# Patient Record
Sex: Female | Born: 2000 | Race: Black or African American | Hispanic: No | Marital: Single | Smoking: Current some day smoker
Health system: Southern US, Community
[De-identification: ages and names within clinical notes are randomized; demographics above are authoritative.]

## PROBLEM LIST (undated history)

## (undated) DIAGNOSIS — N83209 Unspecified ovarian cyst, unspecified side: Secondary | ICD-10-CM

## (undated) DIAGNOSIS — J45909 Unspecified asthma, uncomplicated: Secondary | ICD-10-CM

## (undated) DIAGNOSIS — E282 Polycystic ovarian syndrome: Secondary | ICD-10-CM

## (undated) DIAGNOSIS — B009 Herpesviral infection, unspecified: Secondary | ICD-10-CM

## (undated) DIAGNOSIS — E669 Obesity, unspecified: Secondary | ICD-10-CM

## (undated) HISTORY — PX: LAPAROSCOPIC GASTRIC SLEEVE RESECTION: SHX5895

---

## 2020-10-22 ENCOUNTER — Emergency Department (HOSPITAL_BASED_OUTPATIENT_CLINIC_OR_DEPARTMENT_OTHER)
Admission: EM | Admit: 2020-10-22 | Discharge: 2020-10-22 | Disposition: A | Discharge status: Home / Self Care | Payer: Medicaid Other | Attending: Emergency Medicine | Admitting: Emergency Medicine

## 2020-10-22 ENCOUNTER — Other Ambulatory Visit: Payer: Self-pay

## 2020-10-22 ENCOUNTER — Encounter (HOSPITAL_BASED_OUTPATIENT_CLINIC_OR_DEPARTMENT_OTHER): Payer: Self-pay

## 2020-10-22 ENCOUNTER — Emergency Department (HOSPITAL_BASED_OUTPATIENT_CLINIC_OR_DEPARTMENT_OTHER): Payer: Medicaid Other

## 2020-10-22 DIAGNOSIS — R0789 Other chest pain: Secondary | ICD-10-CM | POA: Diagnosis present

## 2020-10-22 DIAGNOSIS — J45909 Unspecified asthma, uncomplicated: Secondary | ICD-10-CM | POA: Diagnosis not present

## 2020-10-22 DIAGNOSIS — B349 Viral infection, unspecified: Secondary | ICD-10-CM | POA: Insufficient documentation

## 2020-10-22 DIAGNOSIS — R079 Chest pain, unspecified: Secondary | ICD-10-CM

## 2020-10-22 DIAGNOSIS — Z20822 Contact with and (suspected) exposure to covid-19: Secondary | ICD-10-CM | POA: Diagnosis not present

## 2020-10-22 HISTORY — DX: Polycystic ovarian syndrome: E28.2

## 2020-10-22 HISTORY — DX: Unspecified asthma, uncomplicated: J45.909

## 2020-10-22 LAB — RESPIRATORY PANEL BY RT PCR (FLU A&B, COVID)
Influenza A by PCR: NEGATIVE
Influenza B by PCR: NEGATIVE
SARS Coronavirus 2 by RT PCR: NEGATIVE

## 2020-10-22 LAB — BASIC METABOLIC PANEL
Anion gap: 5 (ref 5–15)
BUN: 12 mg/dL (ref 6–20)
CO2: 28 mmol/L (ref 22–32)
Calcium: 8.8 mg/dL — ABNORMAL LOW (ref 8.9–10.3)
Chloride: 103 mmol/L (ref 98–111)
Creatinine, Ser: 0.58 mg/dL (ref 0.44–1.00)
GFR, Estimated: >60 mL/min (ref >60)
Glucose, Bld: 96 mg/dL (ref 70–99)
Potassium: 4 mmol/L (ref 3.5–5.1)
Sodium: 136 mmol/L (ref 135–145)

## 2020-10-22 LAB — URINALYSIS, ROUTINE W REFLEX MICROSCOPIC
Bilirubin Urine: NEGATIVE
Glucose, UA: NEGATIVE mg/dL
Hgb urine dipstick: NEGATIVE
Ketones, ur: NEGATIVE mg/dL
Leukocytes,Ua: NEGATIVE
Nitrite: NEGATIVE
Protein, ur: NEGATIVE mg/dL
Specific Gravity, Urine: 1.02 (ref 1.005–1.030)
pH: 7 (ref 5.0–8.0)

## 2020-10-22 LAB — CBC
HCT: 41.9 % (ref 36.0–46.0)
Hemoglobin: 13 g/dL (ref 12.0–15.0)
MCH: 25.6 pg — ABNORMAL LOW (ref 26.0–34.0)
MCHC: 31 g/dL (ref 30.0–36.0)
MCV: 82.5 fL (ref 80.0–100.0)
Platelets: 308 10*3/uL (ref 150–400)
RBC: 5.08 MIL/uL (ref 3.87–5.11)
RDW: 14.6 % (ref 11.5–15.5)
WBC: 10.4 10*3/uL (ref 4.0–10.5)
nRBC: 0 % (ref 0.0–0.2)

## 2020-10-22 LAB — PREGNANCY, URINE: Preg Test, Ur: NEGATIVE

## 2020-10-22 LAB — D-DIMER, QUANTITATIVE: D-Dimer, Quant: 0.4 ug/mL-FEU (ref 0.00–0.50)

## 2020-10-22 LAB — GROUP A STREP BY PCR: Group A Strep by PCR: NOT DETECTED

## 2020-10-22 LAB — TROPONIN I (HIGH SENSITIVITY): Troponin I (High Sensitivity): <2 ng/L (ref <18)

## 2020-10-22 MED ORDER — SODIUM CHLORIDE 0.9 % IV BOLUS
1000.0000 mL | Freq: Once | INTRAVENOUS | Status: AC
  Administered 2020-10-22: 1000 mL via INTRAVENOUS

## 2020-10-22 MED ORDER — IBUPROFEN 400 MG PO TABS
600.0000 mg | ORAL_TABLET | Freq: Once | ORAL | Status: AC
  Administered 2020-10-22: 600 mg via ORAL
  Filled 2020-10-22: qty 1

## 2020-10-22 MED ORDER — ACETAMINOPHEN 500 MG PO TABS
1000.0000 mg | ORAL_TABLET | Freq: Once | ORAL | Status: AC
  Administered 2020-10-22: 1000 mg via ORAL
  Filled 2020-10-22: qty 2

## 2020-10-22 NOTE — Discharge Instructions (Signed)
Follow-up with your primary doctor regarding your symptoms from today.  Take Tylenol Motrin as needed for pain control.  If you develop worsening difficulty in breathing, chest pain or other new concerning symptom, return to ER for reassessment.

## 2020-10-22 NOTE — ED Provider Notes (Signed)
[facility] EMERGENCY DEPARTMENT Provider Note   CSN: [id number] Arrival date & time: 10/22/20  1301     History Chief Complaint  Patient presents with  . Cough  . Shortness of Breath    Sara Hurley is a 19 y.o. female.  Presents to ER with myriad complaints.  She states over the last 5 or 6 days she has noticed cough, congestion, headache.  Over the last couple days has also noted some chest discomfort as well as some shortness of breath.  Chest pain worse with coughing, occurs at rest, not associated with exertion.  Dull, achy.  Mild to moderate.  No current chest pain.  Sore throat, no difficulty with swallowing, no voice change.  No loss of taste.  No leg pain, uses IUD for contraception.  Morbidly obese, history of asthma, PCOS, s/p gastric sleeve  HPI     Past Medical History:  Diagnosis Date  . Asthma   . PCOS (polycystic ovarian syndrome)     There are no problems to display for this patient.   Past Surgical History:  Procedure Laterality Date  . LAPAROSCOPIC GASTRIC SLEEVE RESECTION       OB History   No obstetric history on file.     No family history on file.  Social History   Tobacco Use  . Smoking status: Never Smoker  . Smokeless tobacco: Never Used  Vaping Use  . Vaping Use: Never used  Substance Use Topics  . Alcohol use: Never  . Drug use: Never    Home Medications Prior to Admission medications   Medication Sig Start Date End Date Taking? Authorizing Provider  EPINEPHrine 0.3 mg/0.3 mL IJ SOAJ injection Inject 0.3 mg into the skin once as needed for anaphylaxis. 09/10/20  Yes [provider]  levonorgestrel (KYLEENA) 19.5 MG IUD 1 each by Intrauterine route once.   Yes [provider]  Multiple Vitamin (MULTIVITAMIN WITH MINERALS) TABS tablet Take 1 tablet by mouth daily.   Yes [provider]  predniSONE (DELTASONE) 20 MG tablet Take 10-60 mg by mouth See admin instructions. Take 29m daily for  3 days, then 448mdaily for 3 days, then 2019maily for 3 days and 24m36mily for 3 days. 10/03/20  Yes [provider]    Allergies    Patient has no known allergies.  Review of Systems   Review of Systems  Constitutional: Positive for chills and fatigue. Negative for fever.  HENT: Negative for ear pain and sore throat.   Eyes: Negative for pain and visual disturbance.  Respiratory: Positive for cough and shortness of breath.   Cardiovascular: Positive for chest pain. Negative for palpitations.  Gastrointestinal: Negative for abdominal pain and vomiting.  Genitourinary: Negative for dysuria and hematuria.  Musculoskeletal: Positive for arthralgias and myalgias. Negative for back pain.  Skin: Negative for color change and rash.  Neurological: Positive for headaches. Negative for seizures and syncope.  All other systems reviewed and are negative.   Physical Exam Updated Vital Signs BP 99/64   Pulse 96   Temp 99.1 F (37.3 C) (Oral)   Resp 16   Ht 5' 7"  (1.702 m)   Wt (!) 193.2 kg   SpO2 100%   BMI 66.72 kg/m   Physical Exam Vitals and nursing note reviewed.  Constitutional:      General: She is not in acute distress.    Appearance: She is well-developed.  HENT:     Head: Normocephalic and atraumatic.  Eyes:  Conjunctiva/sclera: Conjunctivae normal.  Cardiovascular:     Rate and Rhythm: Normal rate and regular rhythm.     Heart sounds: No murmur heard.   Pulmonary:     Effort: Pulmonary effort is normal. No respiratory distress.     Breath sounds: Normal breath sounds.  Abdominal:     Palpations: Abdomen is soft.     Tenderness: There is no abdominal tenderness.  Musculoskeletal:     Cervical back: Neck supple.     Right lower leg: No edema.     Left lower leg: No edema.  Skin:    General: Skin is warm and dry.  Neurological:     General: No focal deficit present.     Mental Status: She is alert.  Psychiatric:        Mood and Affect: Mood  normal.        Behavior: Behavior normal.      ED Results / Procedures / Treatments   Labs (all labs ordered are listed, but only abnormal results are displayed) Labs Reviewed  URINALYSIS, ROUTINE W REFLEX MICROSCOPIC - Abnormal; Notable for the following components:      Result Value   APPearance HAZY (*)    All other components within normal limits  CBC - Abnormal; Notable for the following components:   MCH 25.6 (*)    All other components within normal limits  BASIC METABOLIC PANEL - Abnormal; Notable for the following components:   Calcium 8.8 (*)    All other components within normal limits  RESPIRATORY PANEL BY RT PCR (FLU A&B, COVID)  GROUP A STREP BY PCR  PREGNANCY, URINE  D-DIMER, QUANTITATIVE (NOT AT [facility])  TROPONIN I (HIGH SENSITIVITY)    EKG EKG Interpretation  Date/Time:  Monday October 22 2020 14:11:35 EDT Ventricular Rate:  85 PR Interval:    QRS Duration: 111 QT Interval:  337 QTC Calculation: 401 R Axis:   7 Text Interpretation: Sinus rhythm Confirmed by Madalyn Rob [phone]) on 10/22/2020 2:31:56 PM   Radiology DG Chest Portable 1 View  Result Date: 10/22/2020 CLINICAL DATA:  Chest pain, cough, fever, evaluate for pneumonia. EXAM: PORTABLE CHEST 1 VIEW COMPARISON:  Prior chest radiographs 01/27/2019. FINDINGS: Heart size within normal limits. There is no appreciable airspace consolidation. No evidence of pleural effusion or pneumothorax. No acute bony abnormality identified. IMPRESSION: No evidence of active cardiopulmonary disease. Electronically Signed   By: Kellie Simmering DO   On: 10/22/2020 14:21    Procedures Procedures (including critical care time)  Medications Ordered in ED Medications  acetaminophen (TYLENOL) tablet 1,000 mg (1,000 mg Oral Given 10/22/20 1412)  ibuprofen (ADVIL) tablet 600 mg (600 mg Oral Given 10/22/20 1412)  sodium chloride 0.9 % bolus 1,000 mL (1,000 mLs Intravenous New Bag/Given 10/22/20 1450)    ED Course  I  have reviewed the triage vital signs and the nursing notes.  Pertinent labs & imaging results that were available during my care of the patient were reviewed by me and considered in my medical decision making (see chart for details).  Clinical Course as of Oct 23 1523  Mon Oct 22, 2020  1514 Signed out to Karle Starch   [RD]    Clinical Course User Index [RD] Lucrezia Starch, MD   MDM Rules/Calculators/A&P                         19 year old lady presents to ER with cough, congestion, chest pain, shortness of breath, sore  throat.  On exam patient noted to be well-appearing in no distress.  Lungs were clear, abdomen soft.  Her initial vital signs were noted for borderline soft blood pressure and elevated heart rate.  Repeat vital signs without intervention were markedly improved.  Covid was negative.  CXR negative.  EKG without acute ischemic changes.  Due to initial vital sign abnormality, reported symptoms, will check dimer, troponin to rule out PE, ACS although I have a relatively low suspicion for these complaints.  If negative, suspect most likely viral process.  Signed out to [facility] pending labs.   Final Clinical Impression(s) / ED Diagnoses Final diagnoses:  Acute viral syndrome  Chest pain, unspecified type    Rx / DC Orders ED Discharge Orders    None       Lucrezia Starch, MD 10/22/20 1524

## 2020-10-22 NOTE — ED Triage Notes (Signed)
C/o flu like sx x 1 week-NAD-steady gait 

## 2021-08-12 IMAGING — DX DG CHEST 1V PORT
1 series · 1 of 1 positions shown · non-contrast
Comparison: Prior chest radiographs 01/27/2019.

CLINICAL DATA: Chest pain, cough, fever, evaluate for pneumonia.

EXAM:
PORTABLE CHEST 1 VIEW

[chest ap]
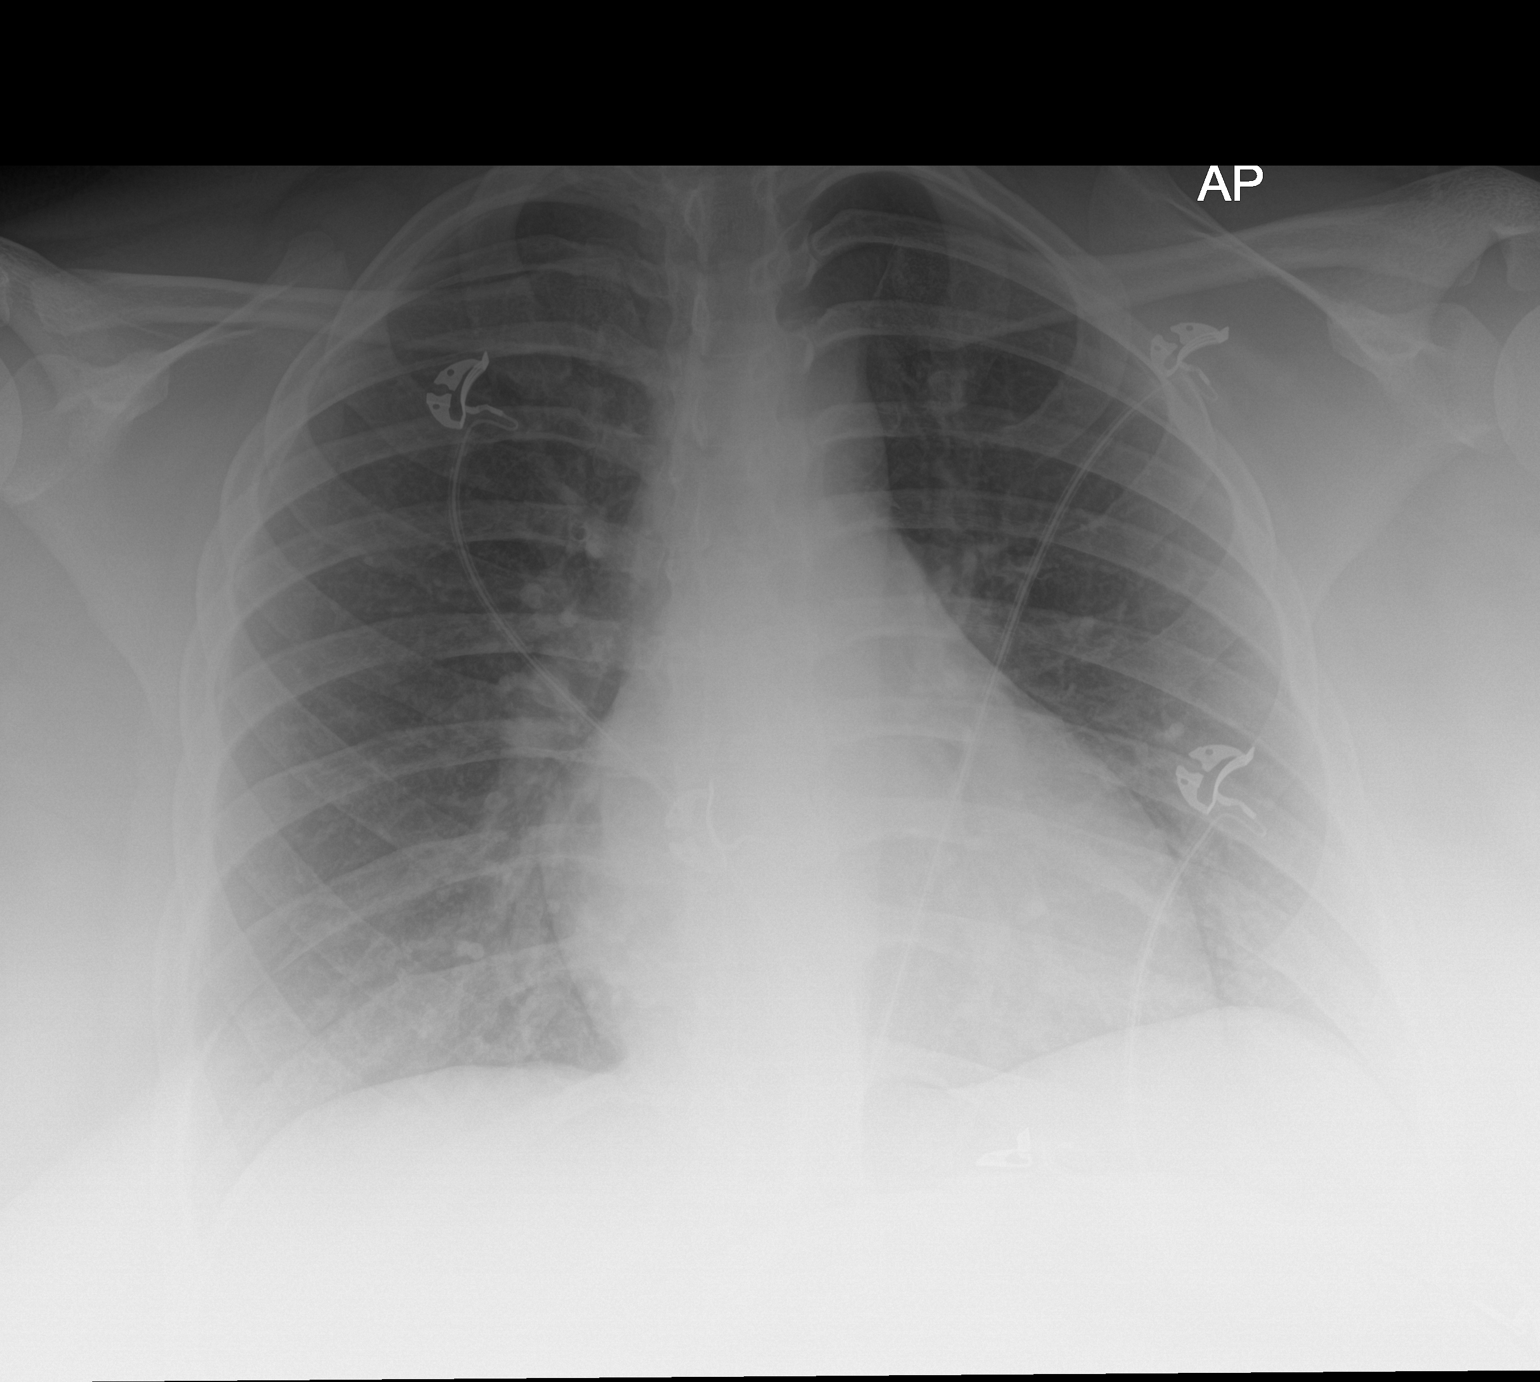

[1 of 1 positions shown; findings below may reference images not displayed]

FINDINGS: Heart size within normal limits.

There is no appreciable airspace consolidation.

No evidence of pleural effusion or pneumothorax.

No acute bony abnormality identified.
IMPRESSION: No evidence of active cardiopulmonary disease.

## 2021-12-16 ENCOUNTER — Other Ambulatory Visit: Payer: Self-pay

## 2021-12-16 ENCOUNTER — Emergency Department (HOSPITAL_BASED_OUTPATIENT_CLINIC_OR_DEPARTMENT_OTHER)
Admission: EM | Admit: 2021-12-16 | Discharge: 2021-12-16 | Disposition: A | Discharge status: Home / Self Care | Payer: Medicaid Other | Attending: Emergency Medicine | Admitting: Emergency Medicine

## 2021-12-16 ENCOUNTER — Encounter (HOSPITAL_BASED_OUTPATIENT_CLINIC_OR_DEPARTMENT_OTHER): Payer: Self-pay

## 2021-12-16 DIAGNOSIS — Z20822 Contact with and (suspected) exposure to covid-19: Secondary | ICD-10-CM | POA: Diagnosis not present

## 2021-12-16 DIAGNOSIS — R110 Nausea: Secondary | ICD-10-CM | POA: Diagnosis not present

## 2021-12-16 DIAGNOSIS — J45909 Unspecified asthma, uncomplicated: Secondary | ICD-10-CM | POA: Diagnosis not present

## 2021-12-16 DIAGNOSIS — J069 Acute upper respiratory infection, unspecified: Secondary | ICD-10-CM | POA: Diagnosis not present

## 2021-12-16 DIAGNOSIS — R059 Cough, unspecified: Secondary | ICD-10-CM | POA: Diagnosis present

## 2021-12-16 DIAGNOSIS — R062 Wheezing: Secondary | ICD-10-CM

## 2021-12-16 DIAGNOSIS — J3489 Other specified disorders of nose and nasal sinuses: Secondary | ICD-10-CM | POA: Diagnosis not present

## 2021-12-16 LAB — RESP PANEL BY RT-PCR (FLU A&B, COVID) ARPGX2
Influenza A by PCR: NEGATIVE
Influenza B by PCR: NEGATIVE
SARS Coronavirus 2 by RT PCR: NEGATIVE

## 2021-12-16 MED ORDER — PREDNISONE 10 MG PO TABS: 40.0000 mg | ORAL_TABLET | Freq: Every day | ORAL | 0 refills | Status: AC

## 2021-12-16 MED ORDER — IPRATROPIUM-ALBUTEROL 0.5-2.5 (3) MG/3ML IN SOLN
3.0000 mL | Freq: Once | RESPIRATORY_TRACT | Status: AC
  Administered 2021-12-16: 16:00:00 3 mL via RESPIRATORY_TRACT
  Filled 2021-12-16: qty 3

## 2021-12-16 MED ORDER — ALBUTEROL SULFATE HFA 108 (90 BASE) MCG/ACT IN AERS
1.0000 | INHALATION_SPRAY | Freq: Four times a day (QID) | RESPIRATORY_TRACT | 0 refills | Status: AC | PRN
Start: 2021-12-16 — End: ?

## 2021-12-16 MED ORDER — DEXAMETHASONE SODIUM PHOSPHATE 10 MG/ML IJ SOLN
8.0000 mg | Freq: Once | INTRAMUSCULAR | Status: AC
  Administered 2021-12-16: 8 mg via INTRAMUSCULAR
  Filled 2021-12-16: qty 1

## 2021-12-16 NOTE — ED Triage Notes (Signed)
Pt c/o flu like sx x 4 days-RT in for assessment prior to triage-pt NAD-steady gait

## 2021-12-16 NOTE — Discharge Instructions (Signed)
I am prescribing you a strong steroid medication called prednisone.  Please only take this as prescribed.  Please take it early in the morning, as this medication can be stimulating and make it difficult to sleep at night.  I have also prescribed you a spare albuterol inhaler.  Please use this as prescribed for wheezing, cough, and shortness of breath.  If you develop any new or worsening symptoms please come back to the emergency department.

## 2021-12-16 NOTE — ED Provider Notes (Signed)
[facility] EMERGENCY DEPARTMENT Provider Note   CSN: [id number] Arrival date & time: 12/16/21  1402     History Chief Complaint  Patient presents with   Cough    Sara Hurley is a 20 y.o. female.  HPI Patient is a 20 year old female with a history of asthma who presents to the emergency department due to cough, rhinorrhea, sore throat, body aches for the past 4 days.  Patient states she woke up this morning with chest pain and shortness of breath as well.  Chest pain occurs with coughing.  Patient notes a history of asthma.  She states that she used her albuterol inhaler at home without significant relief and came to the emergency department.  Reports associated nausea without vomiting or diarrhea.    Past Medical History:  Diagnosis Date   Asthma    PCOS (polycystic ovarian syndrome)     There are no problems to display for this patient.   Past Surgical History:  Procedure Laterality Date   LAPAROSCOPIC GASTRIC SLEEVE RESECTION       OB History   No obstetric history on file.     History reviewed. No pertinent family history.  Social History   Tobacco Use   Smoking status: Never   Smokeless tobacco: Never  Vaping Use   Vaping Use: Former  Substance Use Topics   Alcohol use: Never   Drug use: Never    Home Medications Prior to Admission medications   Medication Sig Start Date End Date Taking? Authorizing Provider  albuterol (VENTOLIN HFA) 108 (90 Base) MCG/ACT inhaler Inhale 1-2 puffs into the lungs every 6 (six) hours as needed for wheezing or shortness of breath. 12/16/21  Yes Placido Sou, PA-C  predniSONE (DELTASONE) 10 MG tablet Take 4 tablets (40 mg total) by mouth daily with breakfast for 4 days. 12/16/21 12/20/21 Yes Placido Sou, PA-C  EPINEPHrine 0.3 mg/0.3 mL IJ SOAJ injection Inject 0.3 mg into the skin once as needed for anaphylaxis. 09/10/20   [provider]  levonorgestrel (KYLEENA) 19.5 MG IUD 1 each by  Intrauterine route once.    [provider]  Multiple Vitamin (MULTIVITAMIN WITH MINERALS) TABS tablet Take 1 tablet by mouth daily.    [provider]    Allergies    Patient has no known allergies.  Review of Systems   Review of Systems  Constitutional:  Positive for chills, fatigue and fever.  HENT:  Positive for congestion, rhinorrhea and sore throat.   Respiratory:  Positive for cough and shortness of breath.   Cardiovascular:  Positive for chest pain.  Gastrointestinal:  Positive for nausea. Negative for diarrhea and vomiting.   Physical Exam Updated Vital Signs BP 127/68 (BP Location: Right Arm)    Pulse 96    Temp 98 F (36.7 C) (Oral)    Resp 20    Ht 5' 7.5" (1.715 m)    LMP 11/25/2021    SpO2 99%    BMI 65.74 kg/m   Physical Exam Vitals and nursing note reviewed.  Constitutional:      General: She is not in acute distress.    Appearance: Normal appearance. She is not ill-appearing, toxic-appearing or diaphoretic.  HENT:     Head: Normocephalic and atraumatic.     Right Ear: External ear normal.     Left Ear: External ear normal.     Nose: Nose normal.     Mouth/Throat:     Mouth: Mucous membranes are moist.  Pharynx: Oropharynx is clear. No oropharyngeal exudate or posterior oropharyngeal erythema.  Eyes:     Extraocular Movements: Extraocular movements intact.  Cardiovascular:     Rate and Rhythm: Normal rate and regular rhythm.     Pulses: Normal pulses.     Heart sounds: Normal heart sounds. No murmur heard.   No friction rub. No gallop.  Pulmonary:     Effort: Pulmonary effort is normal. No respiratory distress.     Breath sounds: No stridor. Wheezing present. No rhonchi or rales.     Comments: Trace expiratory wheezes noted bilaterally.  Oxygen saturations at 99% on room air.  Speaking clearly and coherently.  No respiratory distress noted. Abdominal:     General: Abdomen is flat.     Palpations: Abdomen is soft.     Tenderness:  There is no abdominal tenderness.  Musculoskeletal:        General: Normal range of motion.     Cervical back: Normal range of motion and neck supple. No tenderness.     Comments: No pedal edema.  No calf tenderness.  Skin:    General: Skin is warm and dry.  Neurological:     General: No focal deficit present.     Mental Status: She is alert and oriented to person, place, and time.  Psychiatric:        Mood and Affect: Mood normal.        Behavior: Behavior normal.   ED Results / Procedures / Treatments   Labs (all labs ordered are listed, but only abnormal results are displayed) Labs Reviewed  RESP PANEL BY RT-PCR (FLU A&B, COVID) ARPGX2   EKG None  Radiology No results found.  Procedures Procedures   Medications Ordered in ED Medications  ipratropium-albuterol (DUONEB) 0.5-2.5 (3) MG/3ML nebulizer solution 3 mL (3 mLs Nebulization Given 12/16/21 1557)  dexamethasone (DECADRON) injection 8 mg (8 mg Intramuscular Given 12/16/21 1611)   ED Course  I have reviewed the triage vital signs and the nursing notes.  Pertinent labs & imaging results that were available during my care of the patient were reviewed by me and considered in my medical decision making (see chart for details).    MDM Rules/Calculators/A&P                          Pt is a 20 y.o. female who presents to the emergency department viral URI symptoms as well as chest pain with coughing and shortness of breath.  Patient has a history of asthma.  Labs: Respiratory panel is negative.  I, Placido Sou, PA-C, personally reviewed and evaluated these images and lab results as part of my medical decision-making.  On my exam patient is saturating well at 99% but does have trace expiratory wheezes bilaterally.  She was treated with Decadron as well as a DuoNeb and notes significant improvement in her symptoms.  She is PERC negative.  Doubt PE.  Given her age and lack of risk factors, doubt ACS.  Feel that  patient is stable for discharge at this time and she is agreeable.  We will prescribe her an additional albuterol inhaler.  We will also prescribe her a course of prednisone for her symptoms.  She denies a history of diabetes mellitus.  We discussed return precautions.  Her questions were answered and she was amicable at the time of discharge.  Note: Portions of this report may have been transcribed using voice recognition software. Every effort was  made to ensure accuracy; however, inadvertent computerized transcription errors may be present.   Final Clinical Impression(s) / ED Diagnoses Final diagnoses:  Viral URI with cough  Wheezing   Rx / DC Orders ED Discharge Orders          Ordered    predniSONE (DELTASONE) 10 MG tablet  Daily with breakfast        12/16/21 1656    albuterol (VENTOLIN HFA) 108 (90 Base) MCG/ACT inhaler  Every 6 hours PRN        12/16/21 1656             Placido Sou, PA-C 12/16/21 1701    Charlynne Pander, MD 12/20/21 1157

## 2023-01-04 ENCOUNTER — Encounter (HOSPITAL_BASED_OUTPATIENT_CLINIC_OR_DEPARTMENT_OTHER): Payer: Self-pay

## 2023-01-04 ENCOUNTER — Emergency Department (HOSPITAL_BASED_OUTPATIENT_CLINIC_OR_DEPARTMENT_OTHER)
Admission: EM | Admit: 2023-01-04 | Discharge: 2023-01-05 | Disposition: A | Discharge status: Home / Self Care | Payer: Medicaid Other | Attending: Emergency Medicine | Admitting: Emergency Medicine

## 2023-01-04 ENCOUNTER — Emergency Department (HOSPITAL_BASED_OUTPATIENT_CLINIC_OR_DEPARTMENT_OTHER): Payer: Medicaid Other

## 2023-01-04 ENCOUNTER — Other Ambulatory Visit: Payer: Self-pay

## 2023-01-04 DIAGNOSIS — Z1152 Encounter for screening for COVID-19: Secondary | ICD-10-CM | POA: Diagnosis not present

## 2023-01-04 DIAGNOSIS — J45909 Unspecified asthma, uncomplicated: Secondary | ICD-10-CM | POA: Insufficient documentation

## 2023-01-04 DIAGNOSIS — J205 Acute bronchitis due to respiratory syncytial virus: Secondary | ICD-10-CM | POA: Diagnosis not present

## 2023-01-04 DIAGNOSIS — R059 Cough, unspecified: Secondary | ICD-10-CM | POA: Diagnosis present

## 2023-01-04 LAB — RESP PANEL BY RT-PCR (RSV, FLU A&B, COVID)  RVPGX2
Influenza A by PCR: NEGATIVE
Influenza B by PCR: NEGATIVE
Resp Syncytial Virus by PCR: POSITIVE — AB
SARS Coronavirus 2 by RT PCR: NEGATIVE

## 2023-01-04 MED ORDER — PREDNISONE 10 MG PO TABS: 40.0000 mg | ORAL_TABLET | Freq: Every day | ORAL | 0 refills | Status: DC

## 2023-01-04 NOTE — ED Triage Notes (Addendum)
Pt reports sore throat, productive cough of yellow phlegm, chills, onset Thursday night. She reports chest pain with inspiration.

## 2023-01-04 NOTE — ED Provider Notes (Signed)
[facility] EMERGENCY DEPARTMENT Provider Note   CSN: [id number] Arrival date & time: 01/04/23  1854     History  Chief Complaint  Patient presents with   Cough    Sara Hurley is a 22 y.o. female.  Who presents ED for evaluation of cough productive of yellow sputum, congestion, sore throat, shortness of breath, upper chest pain with deep inspiration.  No chest pain or shortness of breath at rest. symptoms began 4 days ago and have progressively gotten worse.  She has been able to drink like normal, but states that she has had a decreased appetite.  Denies difficulty swallowing.  Denies fevers, chills, hemoptysis, history of DVT or PE, unilateral leg swelling, lower extremity pain, recent immobilization, surgery, cancer treatment, hemoptysis.  Does have [place] IUD in place.  Does not know of any known sick contacts.  Has had bronchitis in the past.  States this feels similar to when she has had bronchitis.   Cough Associated symptoms: shortness of breath        Home Medications Prior to Admission medications   Medication Sig Start Date End Date Taking? Authorizing Provider  predniSONE (DELTASONE) 10 MG tablet Take 4 tablets (40 mg total) by mouth daily with breakfast. 01/04/23  Yes Cherilyn Sautter, [place], PA-C  albuterol (VENTOLIN HFA) 108 (90 Base) MCG/ACT inhaler Inhale 1-2 puffs into the lungs every 6 (six) hours as needed for wheezing or shortness of breath. 12/16/21   Rayna Sexton, PA-C  EPINEPHrine 0.3 mg/0.3 mL IJ SOAJ injection Inject 0.3 mg into the skin once as needed for anaphylaxis. 09/10/20   [provider]  levonorgestrel (KYLEENA) 19.5 MG IUD 1 each by Intrauterine route once.    [provider]  Multiple Vitamin (MULTIVITAMIN WITH MINERALS) TABS tablet Take 1 tablet by mouth daily.    [provider]      Allergies    Patient has no known allergies.    Review of Systems   Review of Systems  Respiratory:  Positive for cough  and shortness of breath.   All other systems reviewed and are negative.   Physical Exam Updated Vital Signs BP (!) 150/103   Pulse 96   Temp 98.6 F (37 C) (Oral)   Resp 20   Ht 5\' 7"  (1.702 m)   Wt (!) 186.9 kg   LMP 12/12/2022 (Approximate)   SpO2 100%   BMI 64.53 kg/m  Physical Exam Vitals and nursing note reviewed.  Constitutional:      General: She is not in acute distress.    Appearance: She is well-developed. She is obese. She is not ill-appearing, toxic-appearing or diaphoretic.  HENT:     Head: Normocephalic and atraumatic.     Mouth/Throat:     Mouth: Mucous membranes are moist.     Pharynx: Oropharynx is clear. No oropharyngeal exudate or posterior oropharyngeal erythema.     Comments: No exudates Eyes:     Conjunctiva/sclera: Conjunctivae normal.  Cardiovascular:     Rate and Rhythm: Normal rate and regular rhythm.     Heart sounds: No murmur heard. Pulmonary:     Effort: Pulmonary effort is normal. No respiratory distress.     Breath sounds: Normal breath sounds. No stridor. No wheezing, rhonchi or rales.  Abdominal:     Palpations: Abdomen is soft.     Tenderness: There is no abdominal tenderness.  Musculoskeletal:        General: No swelling. Normal range of motion.  Cervical back: Neck supple. No rigidity.     Right lower leg: No edema.     Left lower leg: No edema.  Skin:    General: Skin is warm and dry.     Capillary Refill: Capillary refill takes less than 2 seconds.  Neurological:     General: No focal deficit present.     Mental Status: She is alert and oriented to person, place, and time.  Psychiatric:        Mood and Affect: Mood normal.     ED Results / Procedures / Treatments   Labs (all labs ordered are listed, but only abnormal results are displayed) Labs Reviewed  RESP PANEL BY RT-PCR (RSV, FLU A&B, COVID)  RVPGX2 - Abnormal; Notable for the following components:      Result Value   Resp Syncytial Virus by PCR POSITIVE (*)     All other components within normal limits    EKG None  Radiology DG Chest 2 View  Result Date: 01/04/2023 CLINICAL DATA:  sob EXAM: CHEST - 2 VIEW COMPARISON:  10/22/2020 FINDINGS: Cardiac silhouette is unremarkable. No pneumothorax or pleural effusion. The lungs are clear. The visualized skeletal structures are unremarkable. IMPRESSION: No acute cardiopulmonary process. Electronically Signed   By: Layla Maw M.D.   On: 01/04/2023 22:44    Procedures Procedures    Medications Ordered in ED Medications - No data to display  ED Course/ Medical Decision Making/ A&P                           Medical Decision Making Amount and/or Complexity of Data Reviewed Radiology: ordered.  This patient presents to the ED for concern of upper respiratory infection symptoms, this involves an extensive number of treatment options, and is a complaint that carries with it a high risk of complications and morbidity.  The differential diagnosis includes flu, COVID, RSV, other URI, PE, bronchitis  Co morbidities that complicate the patient evaluation   history of asthma, bronchitis.  My initial workup includes respiratory panel, chest x-ray, EKG  Additional history obtained from: Nursing notes from this visit.  I ordered, reviewed and interpreted labs which include: Respiratory panel.  RSV positive.  I ordered imaging studies including chest x-ray I independently visualized and interpreted imaging which showed normal I agree with the radiologist interpretation  Afebrile, hypertensive but otherwise hemodynamically stable.  22 year old female presents ED for evaluation of cough, sore throat, chest tightness.  She did test positive for RSV.  Symptoms began 4 days ago.  Has a history of bronchitis.  States it feels similar to this.  Unable to use PERC score as patient has [place] IUD.  Wells score of 0 and have low suspicion for PE as a cause of his symptoms.  There are no adventitious breath  sounds and patient has no signs of respiratory distress.  She has been using her albuterol inhaler for improvement in her symptoms.  She appears overall normal.  Her posterior pharynx appears normal and have low suspicion for strep.  She will be treated for bronchitis secondary to RSV and given a prescription for prednisone burst and encouraged to follow-up with her primary care provider in 1 week for reevaluation. She has sufficient albuterol at home. She was given strict return precautions. Stable at discharge.  At this time there does not appear to be any evidence of an acute emergency medical condition and the patient appears stable for discharge with appropriate  outpatient follow up. Diagnosis was discussed with patient who verbalizes understanding of care plan and is agreeable to discharge. I have discussed return precautions with patient who verbalizes understanding. Patient encouraged to follow-up with their PCP within 1 week. All questions answered.   Note: Portions of this report may have been transcribed using voice recognition software. Every effort was made to ensure accuracy; however, inadvertent computerized transcription errors may still be present.         Final Clinical Impression(s) / ED Diagnoses Final diagnoses:  RSV bronchitis    Rx / DC Orders ED Discharge Orders          Ordered    predniSONE (DELTASONE) 10 MG tablet  Daily with breakfast        01/04/23 2354              Michelle Piper, PA-C 01/04/23 2357    Terald Sleeper, MD 01/05/23 [phone]

## 2023-01-04 NOTE — Discharge Instructions (Addendum)
You have been seen today for your complaint of upper respiratory infection type symptoms. Your lab work was positive for RSV. Your imaging was unremarkable. Your discharge medications include prednisone.  This is a steroid.  You should take it as prescribed and for the entire duration of the prescription. Follow up with: Your primary care provider in 1 week for reevaluation Please seek immediate medical care if you develop any of the following symptoms: Cough up blood. Feel pain in your chest. Have severe shortness of breath. Faint or keep feeling like you are going to faint. Have a severe headache. Have a fever or chills that get worse. At this time there does not appear to be the presence of an emergent medical condition, however there is always the potential for conditions to change. Please read and follow the below instructions.  Do not take your medicine if  develop an itchy rash, swelling in your mouth or lips, or difficulty breathing; call 911 and seek immediate emergency medical attention if this occurs.  You may review your lab tests and imaging results in their entirety on your MyChart account.  Please discuss all results of fully with your primary care provider and other specialist at your follow-up visit.  Note: Portions of this text may have been transcribed using voice recognition software. Every effort was made to ensure accuracy; however, inadvertent computerized transcription errors may still be present.

## 2023-12-22 HISTORY — PX: OTHER SURGICAL HISTORY: SHX169

## 2023-12-27 ENCOUNTER — Other Ambulatory Visit: Payer: Self-pay

## 2023-12-27 ENCOUNTER — Encounter (HOSPITAL_BASED_OUTPATIENT_CLINIC_OR_DEPARTMENT_OTHER): Payer: Self-pay

## 2023-12-27 ENCOUNTER — Emergency Department (HOSPITAL_BASED_OUTPATIENT_CLINIC_OR_DEPARTMENT_OTHER)
Admission: EM | Admit: 2023-12-27 | Discharge: 2023-12-27 | Disposition: A | Discharge status: Home / Self Care | Payer: Medicaid Other | Attending: Emergency Medicine | Admitting: Emergency Medicine

## 2023-12-27 DIAGNOSIS — L7682 Other postprocedural complications of skin and subcutaneous tissue: Secondary | ICD-10-CM | POA: Insufficient documentation

## 2023-12-27 HISTORY — DX: Herpesviral infection, unspecified: B00.9

## 2023-12-27 HISTORY — DX: Unspecified ovarian cyst, unspecified side: N83.209

## 2023-12-27 MED ORDER — CEPHALEXIN 500 MG PO CAPS: 500.0000 mg | ORAL_CAPSULE | Freq: Four times a day (QID) | ORAL | 0 refills | Status: AC

## 2023-12-27 NOTE — ED Provider Notes (Signed)
[place] EMERGENCY DEPARTMENT AT [facility] Provider Note   CSN: [id number] Arrival date & time: 12/27/23  3664     History  Chief Complaint  Patient presents with   Wound Check    Sara Hurley is a 22 y.o. female.  HPI   22 year old female status post robotic assisted laparoscopic cystectomy with Dr. Clifton Kyrese Gartman on 12/22/2023 who presents to the emergency department with concern for postoperative complication.  The patient states that she has follow-up with her OB/GYN scheduled for January 7.  She noted some purulent discharge expressed from an incision site that "looked like a popped pimple." No redness or pain, no worsening swelling or abdominal pain. No fevers or chills.   Home Medications Prior to Admission medications   Medication Sig Start Date End Date Taking? Authorizing Provider  albuterol (VENTOLIN HFA) 108 (90 Base) MCG/ACT inhaler Inhale 1-2 puffs into the lungs every 6 (six) hours as needed for wheezing or shortness of breath. 12/16/21  Yes Placido Sou, PA-C  cephALEXin (KEFLEX) 500 MG capsule Take 1 capsule (500 mg total) by mouth 4 (four) times daily. 12/27/23  Yes Ernie Avena, MD  ibuprofen (ADVIL) 800 MG tablet Take 800 mg by mouth every 8 (eight) hours as needed for moderate pain (pain score 4-6).   Yes [provider]  Multiple Vitamin (MULTIVITAMIN WITH MINERALS) TABS tablet Take 1 tablet by mouth daily.   Yes [provider]  EPINEPHrine 0.3 mg/0.3 mL IJ SOAJ injection Inject 0.3 mg into the skin once as needed for anaphylaxis. 09/10/20   [provider]  levonorgestrel (KYLEENA) 19.5 MG IUD 1 each by Intrauterine route once.    [provider]  predniSONE (DELTASONE) 10 MG tablet Take 4 tablets (40 mg total) by mouth daily with breakfast. 01/04/23   Schutt, Edsel Petrin, PA-C      Allergies    Patient has no known allergies.    Review of Systems   Review of Systems  All other systems reviewed and are  negative.   Physical Exam Updated Vital Signs BP (!) 131/92 (BP Location: Right Arm)   Pulse 73   Temp 98.3 F (36.8 C) (Oral)   Resp 16   Ht 5\' 8"  (1.727 m)   Wt (!) 194.6 kg   LMP 12/10/2023   SpO2 98%   BMI 65.23 kg/m  Physical Exam Vitals and nursing note reviewed.  Constitutional:      General: She is not in acute distress.    Appearance: She is obese.  HENT:     Head: Normocephalic and atraumatic.  Eyes:     Conjunctiva/sclera: Conjunctivae normal.     Pupils: Pupils are equal, round, and reactive to light.  Cardiovascular:     Rate and Rhythm: Normal rate and regular rhythm.  Pulmonary:     Effort: Pulmonary effort is normal. No respiratory distress.  Abdominal:     General: There is no distension.     Tenderness: There is no abdominal tenderness. There is no guarding.     Comments: Incision sites appear to be c/d/I, mild wound dehiscence at two sites  Musculoskeletal:        General: No deformity or signs of injury.     Cervical back: Neck supple.  Skin:    Findings: No lesion or rash.  Neurological:     General: No focal deficit present.     Mental Status: She is alert. Mental status is at baseline.     ED Results / Procedures /  Treatments   Labs (all labs ordered are listed, but only abnormal results are displayed) Labs Reviewed - No data to display  EKG None  Radiology No results found.  Procedures Procedures    Medications Ordered in ED Medications - No data to display  ED Course/ Medical Decision Making/ A&P                                 Medical Decision Making Risk Prescription drug management.     22 year old female status post robotic assisted laparoscopic cystectomy with Dr. Clifton Syleena Mchan on 12/22/2023 who presents to the emergency department with concern for postoperative complication.  The patient states that she has follow-up with her OB/GYN scheduled for January 7.  She noted some purulent discharge expressed from an incision  site that "looked like a popped pimple." No redness or pain, no worsening swelling or abdominal pain. No fevers or chills.   On arrival, the patient was vitally stable. Exam revealed Incision sites appear to be c/d/I, mild wound dehiscence at two sites, no abd tenderness.   Overall no overt lightest however the patient did state that she was able to express purulence from the right lower quadrant incision site.  In the setting of this, will prescribe a short course of antibiotics, return precautions provided in the event of any severe worsening symptoms, advised outpatient follow-up postoperatively with her OB.  Steri-Strips were replaced and the wound was redressed by nursing staff bedside.   Final Clinical Impression(s) / ED Diagnoses Final diagnoses:  Postoperative surgical complication involving skin associated with non-dermatologic procedure, unspecified complication    Rx / DC Orders ED Discharge Orders          Ordered    cephALEXin (KEFLEX) 500 MG capsule  4 times daily        12/27/23 0911              Ernie Avena, MD 12/27/23 1015

## 2023-12-27 NOTE — ED Triage Notes (Addendum)
In for eval of possible infection to incision site s/p cyst removal x2 to left ovary on Tuesday. 4 sites total and blister noted. Mild yellow drainage noted to bilateral lower incisions.

## 2023-12-27 NOTE — Discharge Instructions (Signed)
Your incision sites have dehisced slightly, continue to dress them, mild drainage should be expected.  Given your report of purulence expressed from 1 site, while they do not look overly infected, we will be cautious and prescribe a short course of antibiotics.  Follow-up with your OB/GYN for a postop check as scheduled.

## 2023-12-27 NOTE — ED Notes (Signed)
Wounds redressed with steri stripes.

## 2024-01-19 ENCOUNTER — Other Ambulatory Visit: Payer: Self-pay

## 2024-01-19 ENCOUNTER — Emergency Department (HOSPITAL_BASED_OUTPATIENT_CLINIC_OR_DEPARTMENT_OTHER)
Admission: EM | Admit: 2024-01-19 | Discharge: 2024-01-19 | Disposition: A | Discharge status: Home / Self Care | Payer: Medicaid Other | Attending: Emergency Medicine | Admitting: Emergency Medicine

## 2024-01-19 ENCOUNTER — Encounter (HOSPITAL_BASED_OUTPATIENT_CLINIC_OR_DEPARTMENT_OTHER): Payer: Self-pay

## 2024-01-19 DIAGNOSIS — J029 Acute pharyngitis, unspecified: Secondary | ICD-10-CM | POA: Diagnosis present

## 2024-01-19 DIAGNOSIS — Z20822 Contact with and (suspected) exposure to covid-19: Secondary | ICD-10-CM | POA: Insufficient documentation

## 2024-01-19 DIAGNOSIS — J020 Streptococcal pharyngitis: Secondary | ICD-10-CM | POA: Insufficient documentation

## 2024-01-19 LAB — RESP PANEL BY RT-PCR (RSV, FLU A&B, COVID)  RVPGX2
Influenza A by PCR: NEGATIVE
Influenza B by PCR: NEGATIVE
Resp Syncytial Virus by PCR: NEGATIVE
SARS Coronavirus 2 by RT PCR: NEGATIVE

## 2024-01-19 LAB — GROUP A STREP BY PCR: Group A Strep by PCR: DETECTED — AB

## 2024-01-19 MED ORDER — PENICILLIN G BENZATHINE 1200000 UNIT/2ML IM SUSY
1.2000 10*6.[IU] | PREFILLED_SYRINGE | Freq: Once | INTRAMUSCULAR | Status: AC
Start: 2024-01-19 — End: 2024-01-19
  Administered 2024-01-19: 1.2 10*6.[IU] via INTRAMUSCULAR
  Filled 2024-01-19: qty 2

## 2024-01-19 NOTE — ED Provider Notes (Signed)
[place] EMERGENCY DEPARTMENT AT [facility] Provider Note   CSN: [id number] Arrival date & time: 01/19/24  2952     History  Chief Complaint  Patient presents with   Sore Throat   Headache    Sara Hurley is a 23 y.o. female.  Patient is a 23 year old whose had a 2 to 3-day history of sore throat.  She woke up this morning with worsening sore throat.  She says it hurts to swallow.  She feels like there is little drainage in the back of her throat.  No significant rhinorrhea.  No known fevers.  No vomiting or diarrhea.  No rashes.  No shortness of breath or trouble swallowing.       Home Medications Prior to Admission medications   Medication Sig Start Date End Date Taking? Authorizing Provider  WEGOVY 0.5 MG/0.5ML SOAJ Inject 0.5 mg into the skin once a week. 01/12/24  Yes [provider]  albuterol (VENTOLIN HFA) 108 (90 Base) MCG/ACT inhaler Inhale 1-2 puffs into the lungs every 6 (six) hours as needed for wheezing or shortness of breath. 12/16/21   Placido Sou, PA-C  cephALEXin (KEFLEX) 500 MG capsule Take 1 capsule (500 mg total) by mouth 4 (four) times daily. 12/27/23   Ernie Avena, MD  EPINEPHrine 0.3 mg/0.3 mL IJ SOAJ injection Inject 0.3 mg into the skin once as needed for anaphylaxis. 09/10/20   [provider]  ibuprofen (ADVIL) 800 MG tablet Take 800 mg by mouth every 8 (eight) hours as needed for moderate pain (pain score 4-6).    [provider]  levonorgestrel (KYLEENA) 19.5 MG IUD 1 each by Intrauterine route once.    [provider]  Multiple Vitamin (MULTIVITAMIN WITH MINERALS) TABS tablet Take 1 tablet by mouth daily.    [provider]  predniSONE (DELTASONE) 10 MG tablet Take 4 tablets (40 mg total) by mouth daily with breakfast. 01/04/23   Schutt, Edsel Petrin, PA-C      Allergies    Patient has no known allergies.    Review of Systems   Review of Systems  Constitutional:  Negative for  chills, diaphoresis, fatigue and fever.  HENT:  Positive for postnasal drip and sore throat. Negative for congestion, facial swelling, rhinorrhea, sneezing, trouble swallowing and voice change.   Eyes: Negative.   Respiratory:  Negative for cough, chest tightness and shortness of breath.   Cardiovascular:  Negative for chest pain and leg swelling.  Gastrointestinal:  Negative for abdominal pain, blood in stool, diarrhea, nausea and vomiting.  Genitourinary:  Negative for difficulty urinating, flank pain, frequency and hematuria.  Musculoskeletal:  Negative for arthralgias and back pain.  Skin:  Negative for rash.  Neurological:  Negative for dizziness, speech difficulty, weakness, numbness and headaches.    Physical Exam Updated Vital Signs BP 139/81   Pulse 96   Temp 98.7 F (37.1 C) (Oral)   Resp 18   Ht 5\' 8"  (1.727 m)   Wt (!) 198.7 kg   LMP 12/10/2023 (Exact Date)   SpO2 99%   BMI 66.60 kg/m  Physical Exam Constitutional:      Appearance: She is well-developed.  HENT:     Head: Normocephalic and atraumatic.     Right Ear: Tympanic membrane normal.     Left Ear: Tympanic membrane normal.     Mouth/Throat:     Mouth: Mucous membranes are moist.     Comments: Mild erythema into the posterior pharynx, there are some exudates on the  right pharynx uvula is midline.  No peritonsillar fullness.  No trismus.  No elevation of the tongue. Eyes:     Pupils: Pupils are equal, round, and reactive to light.  Cardiovascular:     Rate and Rhythm: Normal rate and regular rhythm.     Heart sounds: Normal heart sounds.  Pulmonary:     Effort: Pulmonary effort is normal. No respiratory distress.     Breath sounds: Normal breath sounds. No wheezing or rales.  Chest:     Chest wall: No tenderness.  Abdominal:     General: Bowel sounds are normal.     Palpations: Abdomen is soft.     Tenderness: There is no abdominal tenderness. There is no guarding or rebound.  Musculoskeletal:         General: Normal range of motion.     Cervical back: Normal range of motion and neck supple.  Lymphadenopathy:     Cervical: No cervical adenopathy.  Skin:    General: Skin is warm and dry.     Findings: No rash.  Neurological:     Mental Status: She is alert and oriented to person, place, and time.     ED Results / Procedures / Treatments   Labs (all labs ordered are listed, but only abnormal results are displayed) Labs Reviewed  GROUP A STREP BY PCR - Abnormal; Notable for the following components:      Result Value   Group A Strep by PCR DETECTED (*)    All other components within normal limits  RESP PANEL BY RT-PCR (RSV, FLU A&B, COVID)  RVPGX2    EKG None  Radiology No results found.  Procedures Procedures    Medications Ordered in ED Medications  penicillin g benzathine (BICILLIN LA) 1200000 UNIT/2ML injection 1.2 Million Units (has no administration in time range)    ED Course/ Medical Decision Making/ A&P                                 Medical Decision Making Risk Prescription drug management.   Patient is a 23 year old who presents with sore throat.  Throat exam has some mild erythema and exudates but no clinical suggestions of peritonsillar abscess or other deeper tissue infection.  Strep test was positive.  COVID/flu test was negative.  Patient was given antibiotic options and opted to go with the Bicillin shot.  She was discharged home in good condition.  Return precautions were given.  Final Clinical Impression(s) / ED Diagnoses Final diagnoses:  Streptococcal pharyngitis    Rx / DC Orders ED Discharge Orders     None         Rolan Bucco, MD 01/19/24 [phone]

## 2024-01-19 NOTE — ED Triage Notes (Signed)
Pt endorses post nasal drip, HA, and sore throat x1 week. Has not been around anyone sick. Denies N/V/D.

## 2024-08-21 ENCOUNTER — Encounter (HOSPITAL_BASED_OUTPATIENT_CLINIC_OR_DEPARTMENT_OTHER): Payer: Self-pay

## 2024-08-21 ENCOUNTER — Emergency Department (HOSPITAL_BASED_OUTPATIENT_CLINIC_OR_DEPARTMENT_OTHER)
Admission: EM | Admit: 2024-08-21 | Discharge: 2024-08-21 | Disposition: A | Discharge status: Home / Self Care | Attending: Emergency Medicine | Admitting: Emergency Medicine

## 2024-08-21 ENCOUNTER — Other Ambulatory Visit: Payer: Self-pay

## 2024-08-21 DIAGNOSIS — J069 Acute upper respiratory infection, unspecified: Secondary | ICD-10-CM | POA: Diagnosis not present

## 2024-08-21 DIAGNOSIS — J45901 Unspecified asthma with (acute) exacerbation: Secondary | ICD-10-CM | POA: Insufficient documentation

## 2024-08-21 DIAGNOSIS — R059 Cough, unspecified: Secondary | ICD-10-CM | POA: Diagnosis present

## 2024-08-21 LAB — RESP PANEL BY RT-PCR (RSV, FLU A&B, COVID)  RVPGX2
Influenza A by PCR: NEGATIVE
Influenza B by PCR: NEGATIVE
Resp Syncytial Virus by PCR: NEGATIVE
SARS Coronavirus 2 by RT PCR: NEGATIVE

## 2024-08-21 LAB — GROUP A STREP BY PCR: Group A Strep by PCR: NOT DETECTED

## 2024-08-21 MED ORDER — PREDNISONE 20 MG PO TABS: 40.0000 mg | ORAL_TABLET | Freq: Every day | ORAL | 0 refills | Status: AC

## 2024-08-21 MED ORDER — IPRATROPIUM-ALBUTEROL 0.5-2.5 (3) MG/3ML IN SOLN
3.0000 mL | Freq: Once | RESPIRATORY_TRACT | Status: AC
  Administered 2024-08-21: 3 mL via RESPIRATORY_TRACT
  Filled 2024-08-21: qty 3

## 2024-08-21 MED ORDER — ALBUTEROL SULFATE (2.5 MG/3ML) 0.083% IN NEBU
2.5000 mg | INHALATION_SOLUTION | Freq: Once | RESPIRATORY_TRACT | Status: AC
  Administered 2024-08-21: 2.5 mg via RESPIRATORY_TRACT
  Filled 2024-08-21: qty 3

## 2024-08-21 MED ORDER — PREDNISONE 50 MG PO TABS
60.0000 mg | ORAL_TABLET | Freq: Once | ORAL | Status: AC
  Administered 2024-08-21: 60 mg via ORAL
  Filled 2024-08-21: qty 1

## 2024-08-21 NOTE — ED Provider Notes (Signed)
 [place] EMERGENCY DEPARTMENT AT [facility] Provider Note   CSN: [id number] Arrival date & time: 08/21/24  0255     Patient presents with: Cough   Sara Hurley is a 23 y.o. female.   Presents to the emergency department for flu like illness.  Patient reports a history of asthma.  She normally only needs to use her inhaler when she gets sick.  She has been experiencing runny nose, nasal congestion, sore throat and cough.       Prior to Admission medications   Medication Sig Start Date End Date Taking? Authorizing Provider  predniSONE  (DELTASONE ) 20 MG tablet Take 2 tablets (40 mg total) by mouth daily with breakfast. 08/21/24  Yes [place] Crance, Lonni PARAS, MD  albuterol  (VENTOLIN  HFA) 108 (90 Base) MCG/ACT inhaler Inhale 1-2 puffs into the lungs every 6 (six) hours as needed for wheezing or shortness of breath. 12/16/21   Joldersma, Logan, PA-C  cephALEXin  (KEFLEX ) 500 MG capsule Take 1 capsule (500 mg total) by mouth 4 (four) times daily. 12/27/23   Jerrol Agent, MD  EPINEPHrine 0.3 mg/0.3 mL IJ SOAJ injection Inject 0.3 mg into the skin once as needed for anaphylaxis. 09/10/20   [provider]  ibuprofen  (ADVIL ) 800 MG tablet Take 800 mg by mouth every 8 (eight) hours as needed for moderate pain (pain score 4-6).    [provider]  levonorgestrel (KYLEENA) 19.5 MG IUD 1 each by Intrauterine route once.    [provider]  Multiple Vitamin (MULTIVITAMIN WITH MINERALS) TABS tablet Take 1 tablet by mouth daily.    [provider]  WEGOVY 0.5 MG/0.5ML SOAJ Inject 0.5 mg into the skin once a week. 01/12/24   [provider]    Allergies: Patient has no known allergies.    Review of Systems  Updated Vital Signs BP (!) 133/103   Pulse (!) 109   Temp 98.7 F (37.1 C) (Oral)   Resp 20   LMP 07/29/2024 (Approximate)   SpO2 100%   Physical Exam Vitals and nursing note reviewed.  Constitutional:      General: She is not  in acute distress.    Appearance: She is well-developed.  HENT:     Head: Normocephalic and atraumatic.     Mouth/Throat:     Mouth: Mucous membranes are moist.  Eyes:     General: Vision grossly intact. Gaze aligned appropriately.     Extraocular Movements: Extraocular movements intact.     Conjunctiva/sclera: Conjunctivae normal.  Cardiovascular:     Rate and Rhythm: Normal rate and regular rhythm.     Pulses: Normal pulses.     Heart sounds: Normal heart sounds, S1 normal and S2 normal. No murmur heard.    No friction rub. No gallop.  Pulmonary:     Effort: Pulmonary effort is normal. No respiratory distress.     Breath sounds: Normal breath sounds.  Abdominal:     General: Bowel sounds are normal.     Palpations: Abdomen is soft.     Tenderness: There is no abdominal tenderness. There is no guarding or rebound.     Hernia: No hernia is present.  Musculoskeletal:        General: No swelling.     Cervical back: Full passive range of motion without pain, normal range of motion and neck supple. No spinous process tenderness or muscular tenderness. Normal range of motion.     Right lower leg: No edema.     Left lower leg: No  edema.  Skin:    General: Skin is warm and dry.     Capillary Refill: Capillary refill takes less than 2 seconds.     Findings: No ecchymosis, erythema, rash or wound.  Neurological:     General: No focal deficit present.     Mental Status: She is alert and oriented to person, place, and time.     GCS: GCS eye subscore is 4. GCS verbal subscore is 5. GCS motor subscore is 6.     Cranial Nerves: Cranial nerves 2-12 are intact.     Sensory: Sensation is intact.     Motor: Motor function is intact.     Coordination: Coordination is intact.  Psychiatric:        Attention and Perception: Attention normal.        Mood and Affect: Mood normal.        Speech: Speech normal.        Behavior: Behavior normal.     (all labs ordered are listed, but only  abnormal results are displayed) Labs Reviewed  GROUP A STREP BY PCR  RESP PANEL BY RT-PCR (RSV, FLU A&B, COVID)  RVPGX2    EKG: None  Radiology: No results found.   Procedures   Medications Ordered in the ED  predniSONE  (DELTASONE ) tablet 60 mg (60 mg Oral Given 08/21/24 0321)  ipratropium-albuterol  (DUONEB) 0.5-2.5 (3) MG/3ML nebulizer solution 3 mL (3 mLs Nebulization Given 08/21/24 0323)  albuterol  (PROVENTIL ) (2.5 MG/3ML) 0.083% nebulizer solution 2.5 mg (2.5 mg Nebulization Given 08/21/24 0323)                                    Medical Decision Making Risk Prescription drug management.   Differential Diagnosis considered includes, but not limited to: COVID-19; influenza; RSV; simple viral URI; strep pharyngitis; pneumonia  Patient with flulike illness.  She does have a history of asthma.  She is oxygenating well currently.  No clinical signs of pneumonia on lung exam.     Final diagnoses:  Upper respiratory tract infection, unspecified type  Asthma with acute exacerbation, unspecified asthma severity, unspecified whether persistent    ED Discharge Orders          Ordered    predniSONE  (DELTASONE ) 20 MG tablet  Daily with breakfast        08/21/24 0356               Haze Lonni PARAS, MD 08/21/24 [phone]

## 2024-08-21 NOTE — ED Triage Notes (Signed)
 Pt reports runny nose, sore throat and cough x 2 days that has progressively worsened.

## 2024-11-14 ENCOUNTER — Other Ambulatory Visit: Payer: Self-pay

## 2024-11-14 DIAGNOSIS — J45909 Unspecified asthma, uncomplicated: Secondary | ICD-10-CM | POA: Diagnosis not present

## 2024-11-14 DIAGNOSIS — R059 Cough, unspecified: Secondary | ICD-10-CM | POA: Diagnosis present

## 2024-11-14 DIAGNOSIS — J069 Acute upper respiratory infection, unspecified: Secondary | ICD-10-CM | POA: Insufficient documentation

## 2024-11-14 LAB — GROUP A STREP BY PCR: Group A Strep by PCR: NOT DETECTED

## 2024-11-14 LAB — RESP PANEL BY RT-PCR (RSV, FLU A&B, COVID)  RVPGX2
Influenza A by PCR: NEGATIVE
Influenza B by PCR: NEGATIVE
Resp Syncytial Virus by PCR: NEGATIVE
SARS Coronavirus 2 by RT PCR: NEGATIVE

## 2024-11-14 NOTE — Progress Notes (Signed)
 Subjective Patient ID: Sara Hurley is a 23 y.o. female.  Chief Complaint  Patient presents with  . Shortness of Breath    Pt c/o cough x5 days.  Unknown fever.  Did have sweats and chills today  woke up this morning w/ difficulty breathing. Hx of asthma, used inhaler this morning which did not seem to help  Shortness of breath has not been worsening  Patient  has been taking Mucinex for her symptoms No fatigue Positive rhinorrhea and congestion Positive postnasal drainage No facial pain or pressure  no ear pain or pressure No headaches    The following information was reviewed by members of the visit team:  Tobacco  Allergies  Meds  Problems  Med Hx  Surg Hx  OB Status   Fam Hx  Soc Hx     Review of Systems  Constitutional:  Positive for chills and fatigue. Negative for activity change, appetite change and fever.  HENT:  Positive for congestion and postnasal drip. Negative for rhinorrhea, sinus pressure, sinus pain and sore throat.   Respiratory:  Positive for cough and shortness of breath. Negative for wheezing.   Gastrointestinal:  Negative for abdominal distention, abdominal pain, diarrhea, nausea and vomiting.  Skin:  Negative for rash.  Allergic/Immunologic: Negative for environmental allergies, food allergies and immunocompromised state.       No Known Allergies     Objective Physical Exam Vitals and nursing note reviewed. Exam conducted with a chaperone present.  Constitutional:      General: She is not in acute distress.    Appearance: Normal appearance. She is well-developed and well-groomed. She is obese. She is not ill-appearing, toxic-appearing or diaphoretic.  HENT:     Right Ear: Tympanic membrane, ear canal and external ear normal.     Left Ear: Tympanic membrane, ear canal and external ear normal.     Nose: Congestion and rhinorrhea present.     Mouth/Throat:     Mouth: Mucous membranes are moist.     Pharynx: Oropharynx is clear. No  oropharyngeal exudate or posterior oropharyngeal erythema.  Eyes:     Conjunctiva/sclera: Conjunctivae normal.  Cardiovascular:     Rate and Rhythm: Normal rate and regular rhythm.  Pulmonary:     Effort: Pulmonary effort is normal. No respiratory distress.     Breath sounds: Normal breath sounds. No stridor. No wheezing, rhonchi or rales.  Chest:     Chest wall: No tenderness.  Musculoskeletal:     Cervical back: Normal range of motion and neck supple. No tenderness.  Lymphadenopathy:     Cervical: No cervical adenopathy.  Skin:    Capillary Refill: Capillary refill takes less than 2 seconds.     Findings: No erythema or rash.  Neurological:     Mental Status: She is alert.  Psychiatric:        Behavior: Behavior is cooperative.    XR Chest 2 Views  Final Result by Eduard Vikki Guppy, MD [phone])  XR CHEST 2 VIEWS, 11/14/2024 9:22 AM    INDICATION: Acute cough \ R05.1 Acute cough \ R06.02 Shortness of breath   COMPARISON: Chest radiograph 08/26/2023    FINDINGS:     Cardiovascular: Cardiac silhouette and pulmonary vasculature are within   normal limits.  Mediastinum: Within normal limits.  Lungs/pleura: Clear. No pleural effusion or pneumothorax.  Upper abdomen: Visualized portions are unremarkable.   Chest wall/osseous structures: Unremarkable.      IMPRESSION:  There is no evidence of acute cardiac  or pulmonary abnormality.        Assessment/Plan Diagnoses and all orders for this visit:  Acute cough -     XR Chest 2 Views -     benzonatate (TESSALON) 200 mg capsule; Take 1 capsule (200 mg total) by mouth 3 (three) times a day as needed for cough. Do not take with prom/dm cough syrup -     predniSONE  (DELTASONE ) 10 mg tablet; 6 tablets p.o. today then 5 tablets p.o. x 1 day then 4 tablets p.o. x 1 day then 3 tablets p.o. x 1 day and 2 tablets p.o. x 1 day then 1 tablet x 1 day then DC  Shortness of breath -     XR Chest 2 Views -     benzonatate (TESSALON)  200 mg capsule; Take 1 capsule (200 mg total) by mouth 3 (three) times a day as needed for cough. Do not take with prom/dm cough syrup -     predniSONE  (DELTASONE ) 10 mg tablet; 6 tablets p.o. today then 5 tablets p.o. x 1 day then 4 tablets p.o. x 1 day then 3 tablets p.o. x 1 day and 2 tablets p.o. x 1 day then 1 tablet x 1 day then DC  History of asthma -     benzonatate (TESSALON) 200 mg capsule; Take 1 capsule (200 mg total) by mouth 3 (three) times a day as needed for cough. Do not take with prom/dm cough syrup -     predniSONE  (DELTASONE ) 10 mg tablet; 6 tablets p.o. today then 5 tablets p.o. x 1 day then 4 tablets p.o. x 1 day then 3 tablets p.o. x 1 day and 2 tablets p.o. x 1 day then 1 tablet x 1 day then DC  Other orders -     phentermine 37.5 mg tab; 1 tablet daily. 30 minutes before breakfast (limit caffeine) -     albuterol  HFA (PROVENTIL  HFA;VENTOLIN  HFA;PROAIR  HFA) 90 mcg/actuation inhaler; Inhale 2 puffs every 6 (six) hours as needed for wheezing. -     albuterol  2.5 mg /3 mL (0.083 %) nebulizer solution; Take 3 mL (2.5 mg total) by nebulization every 6 (six) hours as needed for wheezing or shortness of breath.     Electronically signed: Neville Shawnee Oppenheim, PA-C 11/14/2024  9:46 AM

## 2024-11-14 NOTE — ED Triage Notes (Signed)
 Coughing phlegm. Chills. Afebrile. Sore throat. HX asthma UC gave prednisone  and refill for neb. NAD in triage, no SOB.

## 2024-11-15 ENCOUNTER — Emergency Department (HOSPITAL_BASED_OUTPATIENT_CLINIC_OR_DEPARTMENT_OTHER)
Admission: EM | Admit: 2024-11-15 | Discharge: 2024-11-15 | Disposition: A | Discharge status: Home / Self Care | Attending: Emergency Medicine | Admitting: Emergency Medicine

## 2024-11-15 DIAGNOSIS — J069 Acute upper respiratory infection, unspecified: Secondary | ICD-10-CM

## 2024-11-15 NOTE — ED Provider Notes (Signed)
 [place] EMERGENCY DEPARTMENT AT [facility]  Provider Note  CSN: [id number] Arrival date & time: 11/14/24 1959  History Chief Complaint  Patient presents with   Cough    Sara Hurley is a 23 y.o. female with history of asthma reports about a week of cough and congestion with SOB at times. No fevers, but had some chills. She went to UC earlier in the day where CXR was negative, given steroids and refill of nebulizer but no swabs were done. She presents tonight because she was still having SOB and concerned about Covid/Flu/RSV.    Home Medications Prior to Admission medications   Medication Sig Start Date End Date Taking? Authorizing Provider  albuterol  (VENTOLIN  HFA) 108 (90 Base) MCG/ACT inhaler Inhale 1-2 puffs into the lungs every 6 (six) hours as needed for wheezing or shortness of breath. 12/16/21   Joldersma, Logan, PA-C  cephALEXin  (KEFLEX ) 500 MG capsule Take 1 capsule (500 mg total) by mouth 4 (four) times daily. 12/27/23   Jerrol Agent, MD  EPINEPHrine 0.3 mg/0.3 mL IJ SOAJ injection Inject 0.3 mg into the skin once as needed for anaphylaxis. 09/10/20   [provider]  ibuprofen  (ADVIL ) 800 MG tablet Take 800 mg by mouth every 8 (eight) hours as needed for moderate pain (pain score 4-6).    [provider]  levonorgestrel (KYLEENA) 19.5 MG IUD 1 each by Intrauterine route once.    [provider]  Multiple Vitamin (MULTIVITAMIN WITH MINERALS) TABS tablet Take 1 tablet by mouth daily.    [provider]  predniSONE  (DELTASONE ) 20 MG tablet Take 2 tablets (40 mg total) by mouth daily with breakfast. 08/21/24   Pollina, Lonni PARAS, MD  WEGOVY 0.5 MG/0.5ML SOAJ Inject 0.5 mg into the skin once a week. 01/12/24   [provider]     Allergies    Patient has no known allergies.   Review of Systems   Review of Systems Please see HPI for pertinent positives and negatives  Physical Exam BP (!) 150/97 (BP Location:  Right Arm)   Pulse 93   Temp 98.2 F (36.8 C)   Resp 19   SpO2 100%   Physical Exam Vitals and nursing note reviewed.  Constitutional:      Appearance: Normal appearance.  HENT:     Head: Normocephalic and atraumatic.     Nose: Nose normal.     Mouth/Throat:     Mouth: Mucous membranes are moist.  Eyes:     Extraocular Movements: Extraocular movements intact.     Conjunctiva/sclera: Conjunctivae normal.  Cardiovascular:     Rate and Rhythm: Normal rate.  Pulmonary:     Effort: Pulmonary effort is normal.     Breath sounds: Normal breath sounds. No wheezing or rales.  Abdominal:     General: Abdomen is flat.     Palpations: Abdomen is soft.     Tenderness: There is no abdominal tenderness.  Musculoskeletal:        General: No swelling. Normal range of motion.     Cervical back: Neck supple.  Skin:    General: Skin is warm and dry.  Neurological:     General: No focal deficit present.     Mental Status: She is alert.  Psychiatric:        Mood and Affect: Mood normal.     ED Results / Procedures / Treatments   EKG None  Procedures Procedures  Medications Ordered in the ED Medications - No data  to display  Initial Impression and Plan  Patient here with about a week of viral illness. Exam and vitals reassuring. Outpatient CXR today is negative. Covid/Flu/RSV and strep swabs are neg. Recommend she continue with symptomatic care at home. PCP follow up, RTED for any other concerns.    ED Course       MDM Rules/Calculators/A&P Medical Decision Making Problems Addressed: Viral URI with cough: acute illness or injury  Amount and/or Complexity of Data Reviewed Labs: ordered. Decision-making details documented in ED Course.  Risk Prescription drug management.     Final Clinical Impression(s) / ED Diagnoses Final diagnoses:  Viral URI with cough    Rx / DC Orders ED Discharge Orders     None        Roselyn Carlin NOVAK, MD 11/15/24 0022

## 2025-01-16 ENCOUNTER — Other Ambulatory Visit: Payer: Self-pay

## 2025-01-16 ENCOUNTER — Emergency Department (HOSPITAL_BASED_OUTPATIENT_CLINIC_OR_DEPARTMENT_OTHER)
Admission: EM | Admit: 2025-01-16 | Discharge: 2025-01-16 | Disposition: A | Discharge status: Home / Self Care | Attending: Emergency Medicine | Admitting: Emergency Medicine

## 2025-01-16 ENCOUNTER — Encounter (HOSPITAL_BASED_OUTPATIENT_CLINIC_OR_DEPARTMENT_OTHER): Payer: Self-pay | Admitting: *Deleted

## 2025-01-16 DIAGNOSIS — J069 Acute upper respiratory infection, unspecified: Secondary | ICD-10-CM | POA: Diagnosis not present

## 2025-01-16 DIAGNOSIS — J029 Acute pharyngitis, unspecified: Secondary | ICD-10-CM | POA: Insufficient documentation

## 2025-01-16 HISTORY — DX: Obesity, unspecified: E66.9

## 2025-01-16 LAB — RESP PANEL BY RT-PCR (RSV, FLU A&B, COVID)  RVPGX2
Influenza A by PCR: NEGATIVE
Influenza B by PCR: NEGATIVE
Resp Syncytial Virus by PCR: NEGATIVE
SARS Coronavirus 2 by RT PCR: NEGATIVE

## 2025-01-16 LAB — GROUP A STREP BY PCR: Group A Strep by PCR: NOT DETECTED

## 2025-01-16 MED ORDER — BENZONATATE 100 MG PO CAPS: 100.0000 mg | ORAL_CAPSULE | Freq: Three times a day (TID) | ORAL | 0 refills | Status: AC

## 2025-01-16 MED ORDER — NAPROXEN 375 MG PO TABS: 375.0000 mg | ORAL_TABLET | Freq: Two times a day (BID) | ORAL | 0 refills | Status: AC

## 2025-01-16 NOTE — Discharge Instructions (Signed)
 Take the medication as prescribed to help with your sore throat and cough.  Follow-up with your primary care doctor to be rechecked if you are not feeling better by the end of the week

## 2025-01-16 NOTE — ED Provider Notes (Signed)
 " [place] EMERGENCY DEPARTMENT AT [facility] Provider Note   CSN: [id number] Arrival date & time: 01/16/25  1643     Patient presents with: URI and Sore Throat   Sara Hurley is a 24 y.o. female.    URI Sore Throat     Patient presents to the ED with complaints of URI symptoms ongoing for couple of weeks.  Patient states she went to an urgent care and they tested for flu and it was negative.  Patient states her symptoms have persisted.  She has had coughing she has now noticed pain in her left ear she has also had a sore throat.  It hurts for her to swallow.  No known fevers.  No shortness of breath  Prior to Admission medications  Medication Sig Start Date End Date Taking? Authorizing Provider  benzonatate  (TESSALON ) 100 MG capsule Take 1 capsule (100 mg total) by mouth every 8 (eight) hours. 01/16/25  Yes Randol Simmonds, MD  naproxen  (NAPROSYN ) 375 MG tablet Take 1 tablet (375 mg total) by mouth 2 (two) times daily. 01/16/25  Yes Randol Simmonds, MD  albuterol  (VENTOLIN  HFA) [phone] Base) MCG/ACT inhaler Inhale 1-2 puffs into the lungs every 6 (six) hours as needed for wheezing or shortness of breath. 12/16/21   Joldersma, Logan, PA-C  cephALEXin  (KEFLEX ) 500 MG capsule Take 1 capsule (500 mg total) by mouth 4 (four) times daily. 12/27/23   Jerrol Agent, MD  EPINEPHrine 0.3 mg/0.3 mL IJ SOAJ injection Inject 0.3 mg into the skin once as needed for anaphylaxis. 09/10/20   [provider]  ibuprofen  (ADVIL ) 800 MG tablet Take 800 mg by mouth every 8 (eight) hours as needed for moderate pain (pain score 4-6).    [provider]  levonorgestrel (KYLEENA) 19.5 MG IUD 1 each by Intrauterine route once.    [provider]  Multiple Vitamin (MULTIVITAMIN WITH MINERALS) TABS tablet Take 1 tablet by mouth daily.    [provider]  predniSONE  (DELTASONE ) 20 MG tablet Take 2 tablets (40 mg total) by mouth daily with breakfast. 08/21/24   Pollina,  Lonni PARAS, MD  WEGOVY 0.5 MG/0.5ML SOAJ Inject 0.5 mg into the skin once a week. 01/12/24   [provider]    Allergies: Patient has no known allergies.    Review of Systems  Updated Vital Signs BP (!) 143/98   Pulse 92   Temp 99 F (37.2 C) (Oral)   Resp 15   SpO2 99%   Physical Exam Vitals and nursing note reviewed.  Constitutional:      General: She is not in acute distress.    Appearance: She is well-developed.  HENT:     Head: Normocephalic and atraumatic.     Right Ear: Tympanic membrane and external ear normal.     Left Ear: Tympanic membrane and external ear normal.     Nose: No congestion or rhinorrhea.     Mouth/Throat:     Mouth: No oral lesions.     Pharynx: No pharyngeal swelling, oropharyngeal exudate or posterior oropharyngeal erythema.  Eyes:     General: No scleral icterus.       Right eye: No discharge.        Left eye: No discharge.     Conjunctiva/sclera: Conjunctivae normal.  Neck:     Trachea: No tracheal deviation.  Cardiovascular:     Rate and Rhythm: Normal rate and regular rhythm.  Pulmonary:     Effort: Pulmonary effort is normal.  No respiratory distress.     Breath sounds: Normal breath sounds. No stridor. No wheezing or rhonchi.  Abdominal:     General: There is no distension.  Musculoskeletal:        General: No swelling or deformity.     Cervical back: Neck supple.  Skin:    General: Skin is warm and dry.     Findings: No rash.  Neurological:     Mental Status: She is alert. Mental status is at baseline.     Cranial Nerves: No dysarthria or facial asymmetry.     Motor: No seizure activity.     (all labs ordered are listed, but only abnormal results are displayed) Labs Reviewed  GROUP A STREP BY PCR  RESP PANEL BY RT-PCR (RSV, FLU A&B, COVID)  [id number]    EKG: None  Radiology: No results found.   Procedures   Medications Ordered in the ED - No data to display                                  Medical  Decision Making Risk Prescription drug management.   Patient's COVID flu Enza RSV is negative.  Her strep test is also negative.  Exam is overall reassuring.  Lungs are clear.  No signs of pneumonia.  Suspect viral etiology.  Will discharge home with medications for symptomatic relief.  Evaluation and diagnostic testing in the emergency department does not suggest an emergent condition requiring admission or immediate intervention beyond what has been performed at this time.  The patient is safe for discharge and has been instructed to return immediately for worsening symptoms, change in symptoms or any other concerns.     Final diagnoses:  Pharyngitis, unspecified etiology  Upper respiratory tract infection, unspecified type    ED Discharge Orders          Ordered    naproxen  (NAPROSYN ) 375 MG tablet  2 times daily        01/16/25 2048    benzonatate  (TESSALON ) 100 MG capsule  Every 8 hours        01/16/25 2048               Randol Simmonds, MD 01/16/25 2050  "

## 2025-01-16 NOTE — ED Triage Notes (Signed)
 Pt with URI since around the first of the year.  Pt was seen 1/5 and dx with cough.  Pt is having cough and left sided throat pain, painful to swallow.
